# Patient Record
Sex: Male | Born: 1960 | Race: Black or African American | Hispanic: No | Marital: Married | Smoking: Former smoker
Health system: Southern US, Community
[De-identification: ages and names within clinical notes are randomized; demographics above are authoritative.]

## PROBLEM LIST (undated history)

## (undated) DIAGNOSIS — E039 Hypothyroidism, unspecified: Secondary | ICD-10-CM

## (undated) DIAGNOSIS — M109 Gout, unspecified: Secondary | ICD-10-CM

## (undated) HISTORY — PX: KNEE SURGERY: SHX244

---

## 2000-01-17 ENCOUNTER — Encounter: Payer: Self-pay | Admitting: Internal Medicine

## 2000-01-17 ENCOUNTER — Emergency Department (HOSPITAL_COMMUNITY): Admission: EM | Admit: 2000-01-17 | Discharge: 2000-01-17 | Payer: Self-pay | Admitting: Internal Medicine

## 2000-12-24 ENCOUNTER — Encounter: Payer: Self-pay | Admitting: Emergency Medicine

## 2000-12-24 ENCOUNTER — Emergency Department (HOSPITAL_COMMUNITY): Admission: EM | Admit: 2000-12-24 | Discharge: 2000-12-24 | Payer: Self-pay | Admitting: Emergency Medicine

## 2001-05-23 ENCOUNTER — Emergency Department (HOSPITAL_COMMUNITY): Admission: EM | Admit: 2001-05-23 | Discharge: 2001-05-23 | Payer: Self-pay | Admitting: Emergency Medicine

## 2002-05-19 ENCOUNTER — Emergency Department (HOSPITAL_COMMUNITY): Admission: EM | Admit: 2002-05-19 | Discharge: 2002-05-20 | Payer: Self-pay | Admitting: Emergency Medicine

## 2003-05-16 ENCOUNTER — Emergency Department (HOSPITAL_COMMUNITY): Admission: EM | Admit: 2003-05-16 | Discharge: 2003-05-16 | Payer: Self-pay | Admitting: Emergency Medicine

## 2003-05-21 ENCOUNTER — Emergency Department (HOSPITAL_COMMUNITY): Admission: EM | Admit: 2003-05-21 | Discharge: 2003-05-21 | Payer: Self-pay | Admitting: Emergency Medicine

## 2003-05-23 ENCOUNTER — Emergency Department (HOSPITAL_COMMUNITY): Admission: EM | Admit: 2003-05-23 | Discharge: 2003-05-23 | Payer: Self-pay | Admitting: *Deleted

## 2003-07-21 ENCOUNTER — Encounter: Payer: Self-pay | Admitting: Family Medicine

## 2003-07-21 ENCOUNTER — Encounter: Admission: RE | Admit: 2003-07-21 | Discharge: 2003-07-21 | Payer: Self-pay | Admitting: Family Medicine

## 2005-03-29 ENCOUNTER — Emergency Department (HOSPITAL_COMMUNITY): Admission: EM | Admit: 2005-03-29 | Discharge: 2005-03-29 | Payer: Self-pay | Admitting: Emergency Medicine

## 2005-03-30 ENCOUNTER — Emergency Department (HOSPITAL_COMMUNITY): Admission: AD | Admit: 2005-03-30 | Discharge: 2005-03-30 | Payer: Self-pay | Admitting: Family Medicine

## 2005-04-03 ENCOUNTER — Ambulatory Visit: Payer: Self-pay | Admitting: Internal Medicine

## 2005-04-03 ENCOUNTER — Ambulatory Visit: Payer: Self-pay | Admitting: *Deleted

## 2005-04-03 ENCOUNTER — Ambulatory Visit: Payer: Self-pay | Admitting: Nurse Practitioner

## 2005-04-21 ENCOUNTER — Emergency Department (HOSPITAL_COMMUNITY): Admission: EM | Admit: 2005-04-21 | Discharge: 2005-04-21 | Payer: Self-pay | Admitting: Family Medicine

## 2005-04-22 ENCOUNTER — Ambulatory Visit: Payer: Self-pay | Admitting: Nurse Practitioner

## 2005-05-06 ENCOUNTER — Ambulatory Visit: Payer: Self-pay | Admitting: Nurse Practitioner

## 2005-05-10 ENCOUNTER — Encounter: Admission: RE | Admit: 2005-05-10 | Discharge: 2005-05-10 | Payer: Self-pay | Admitting: Internal Medicine

## 2005-05-28 ENCOUNTER — Ambulatory Visit: Payer: Self-pay | Admitting: Nurse Practitioner

## 2005-08-11 ENCOUNTER — Ambulatory Visit: Payer: Self-pay | Admitting: Nurse Practitioner

## 2005-08-20 ENCOUNTER — Ambulatory Visit: Payer: Self-pay | Admitting: Nurse Practitioner

## 2005-09-03 ENCOUNTER — Ambulatory Visit: Payer: Self-pay | Admitting: Nurse Practitioner

## 2006-01-14 ENCOUNTER — Emergency Department (HOSPITAL_COMMUNITY): Admission: EM | Admit: 2006-01-14 | Discharge: 2006-01-14 | Payer: Self-pay | Admitting: Emergency Medicine

## 2006-09-23 ENCOUNTER — Ambulatory Visit: Payer: Self-pay | Admitting: Family Medicine

## 2007-08-13 DIAGNOSIS — E0590 Thyrotoxicosis, unspecified without thyrotoxic crisis or storm: Secondary | ICD-10-CM | POA: Insufficient documentation

## 2008-02-09 ENCOUNTER — Ambulatory Visit: Payer: Self-pay | Admitting: Family Medicine

## 2008-02-09 DIAGNOSIS — B356 Tinea cruris: Secondary | ICD-10-CM | POA: Insufficient documentation

## 2008-02-10 LAB — CONVERTED CEMR LAB
ALT: 22 units/L (ref 0–53)
AST: 19 units/L (ref 0–37)
Alkaline Phosphatase: 74 units/L (ref 39–117)
BUN: 8 mg/dL (ref 6–23)
Basophils Relative: 0.2 % (ref 0.0–1.0)
CO2: 31 meq/L (ref 19–32)
Calcium: 9.3 mg/dL (ref 8.4–10.5)
Chloride: 107 meq/L (ref 96–112)
Cholesterol: 143 mg/dL (ref 0–200)
Direct LDL: 80.5 mg/dL
Eosinophils Relative: 2.4 % (ref 0.0–5.0)
GFR calc Af Amer: 84 mL/min
Glucose, Bld: 96 mg/dL (ref 70–99)
HDL: 34.2 mg/dL — ABNORMAL LOW (ref >39.0)
Monocytes Relative: 8 % (ref 3.0–11.0)
Neutro Abs: 4.3 10*3/uL (ref 1.4–7.7)
Platelets: 205 10*3/uL (ref 150–400)
RBC: 5.32 M/uL (ref 4.22–5.81)
RDW: 12.5 % (ref 11.5–14.6)
TSH: 2.52 microintl units/mL (ref 0.35–5.50)
Total CHOL/HDL Ratio: 4.2
Total Protein: 7.1 g/dL (ref 6.0–8.3)
Triglycerides: 206 mg/dL (ref 0–149)
VLDL: 41 mg/dL — ABNORMAL HIGH (ref 0–40)
WBC: 8.2 10*3/uL (ref 4.5–10.5)

## 2008-02-23 ENCOUNTER — Ambulatory Visit: Payer: Self-pay | Admitting: Family Medicine

## 2008-02-23 DIAGNOSIS — E876 Hypokalemia: Secondary | ICD-10-CM | POA: Insufficient documentation

## 2008-03-08 ENCOUNTER — Ambulatory Visit: Payer: Self-pay | Admitting: Family Medicine

## 2008-03-09 LAB — CONVERTED CEMR LAB: Potassium: 3.8 meq/L (ref 3.5–5.1)

## 2008-08-16 ENCOUNTER — Ambulatory Visit: Payer: Self-pay | Admitting: Family Medicine

## 2009-02-08 ENCOUNTER — Encounter: Payer: Self-pay | Admitting: Family Medicine

## 2009-02-08 ENCOUNTER — Emergency Department (HOSPITAL_COMMUNITY): Admission: EM | Admit: 2009-02-08 | Discharge: 2009-02-08 | Payer: Self-pay | Admitting: Emergency Medicine

## 2009-02-16 ENCOUNTER — Ambulatory Visit: Payer: Self-pay | Admitting: Family Medicine

## 2009-02-16 DIAGNOSIS — J209 Acute bronchitis, unspecified: Secondary | ICD-10-CM

## 2009-03-02 ENCOUNTER — Ambulatory Visit: Payer: Self-pay | Admitting: Family Medicine

## 2011-01-05 ENCOUNTER — Encounter: Payer: Self-pay | Admitting: Internal Medicine

## 2011-08-01 ENCOUNTER — Encounter: Payer: Self-pay | Admitting: *Deleted

## 2011-08-01 ENCOUNTER — Emergency Department (HOSPITAL_BASED_OUTPATIENT_CLINIC_OR_DEPARTMENT_OTHER)
Admission: EM | Admit: 2011-08-01 | Discharge: 2011-08-02 | Disposition: A | Discharge status: Home / Self Care | Payer: Self-pay | Attending: Emergency Medicine | Admitting: Emergency Medicine

## 2011-08-01 DIAGNOSIS — R51 Headache: Secondary | ICD-10-CM | POA: Insufficient documentation

## 2011-08-01 DIAGNOSIS — R0602 Shortness of breath: Secondary | ICD-10-CM | POA: Insufficient documentation

## 2011-08-01 DIAGNOSIS — E039 Hypothyroidism, unspecified: Secondary | ICD-10-CM | POA: Insufficient documentation

## 2011-08-01 DIAGNOSIS — R5381 Other malaise: Secondary | ICD-10-CM | POA: Insufficient documentation

## 2011-08-01 DIAGNOSIS — R5383 Other fatigue: Secondary | ICD-10-CM | POA: Insufficient documentation

## 2011-08-01 HISTORY — DX: Hypothyroidism, unspecified: E03.9

## 2011-08-01 NOTE — ED Notes (Signed)
Pt c/o constant headache x2-3 weeks. Somewhat relieved with Ibuprofen, but returns soon after. Denies any increase in stress, injury or recent illness. Pt has hx of thyroid disease, but is not currently on medications. States he does not remember the last time he had his thyroid level checked, and does not currently have a PMD. Pt c/o generalized fatigue, weakness and SOB with exertion. Hx of anxiety "and maybe its some of that."

## 2011-08-01 NOTE — ED Notes (Signed)
Headache x2-3weeks. Also c/o shortness of breath with generalized fatigue/weakness x 2 weeks. Sore throat x1 week. Pt denies recent cold/cough symptoms.

## 2011-08-02 ENCOUNTER — Other Ambulatory Visit: Payer: Self-pay

## 2011-08-02 ENCOUNTER — Emergency Department (INDEPENDENT_AMBULATORY_CARE_PROVIDER_SITE_OTHER): Payer: Self-pay

## 2011-08-02 DIAGNOSIS — Z862 Personal history of diseases of the blood and blood-forming organs and certain disorders involving the immune mechanism: Secondary | ICD-10-CM

## 2011-08-02 DIAGNOSIS — R5381 Other malaise: Secondary | ICD-10-CM

## 2011-08-02 DIAGNOSIS — R0602 Shortness of breath: Secondary | ICD-10-CM

## 2011-08-02 DIAGNOSIS — Z8639 Personal history of other endocrine, nutritional and metabolic disease: Secondary | ICD-10-CM

## 2011-08-02 DIAGNOSIS — R5383 Other fatigue: Secondary | ICD-10-CM

## 2011-08-02 DIAGNOSIS — R51 Headache: Secondary | ICD-10-CM

## 2011-08-02 LAB — CBC
MCH: 28 pg (ref 26.0–34.0)
MCHC: 33.9 g/dL (ref 30.0–36.0)
MCV: 82.5 fL (ref 78.0–100.0)
Platelets: 191 10*3/uL (ref 150–400)
RBC: 4.79 MIL/uL (ref 4.22–5.81)

## 2011-08-02 LAB — URINALYSIS, ROUTINE W REFLEX MICROSCOPIC
Bilirubin Urine: NEGATIVE
Glucose, UA: NEGATIVE mg/dL
Ketones, ur: NEGATIVE mg/dL
pH: 6 (ref 5.0–8.0)

## 2011-08-02 LAB — BASIC METABOLIC PANEL
CO2: 25 mEq/L (ref 19–32)
Calcium: 9.3 mg/dL (ref 8.4–10.5)
Creatinine, Ser: 1 mg/dL (ref 0.50–1.35)
Glucose, Bld: 98 mg/dL (ref 70–99)

## 2011-08-02 LAB — TSH: TSH: 3.642 u[IU]/mL (ref 0.350–4.500)

## 2011-08-02 NOTE — ED Provider Notes (Signed)
History     CSN: [id number] Arrival date & time: 08/01/2011 11:20 PM  Chief Complaint  Patient presents with  . Headache   HPI Comments: Pt has hx of hypothyroidism.  Has been off meds for about a year.  Complains tonight of throbbing headache off/on for 2 weeks.  Also has been more tired than normal.  Having some SOB on exertion, no pedal edema, no chest pain, no orthopnea, no pleuritic pain.  No head injury  Patient is a 50 y.o. male presenting with headaches. The history is provided by the patient.  Headache  This is a new problem. The current episode started more than 1 week ago. Episode frequency: waxing and waning. The problem has been gradually worsening. The headache is associated with bright light and emotional stress. The pain is located in the bilateral region. The quality of the pain is described as dull and throbbing. The pain is at a severity of 4/10. The pain is moderate. The pain does not radiate. Associated symptoms include malaise/fatigue and shortness of breath. Pertinent negatives include no fever, no chest pressure, no near-syncope, no orthopnea, no palpitations, no syncope, no nausea and no vomiting. He has tried nothing for the symptoms.    Past Medical History  Diagnosis Date  . Hypothyroid     Past Surgical History  Procedure Date  . Knee surgery     No family history on file.  History  Substance Use Topics  . Smoking status: Former Games developer  . Smokeless tobacco: Not on file  . Alcohol Use: No      Review of Systems  Constitutional: Positive for malaise/fatigue and fatigue. Negative for fever and appetite change.  HENT: Negative for congestion, facial swelling and rhinorrhea.   Eyes: Negative.   Respiratory: Positive for shortness of breath.   Cardiovascular: Negative for palpitations, orthopnea, syncope and near-syncope.  Gastrointestinal: Negative.  Negative for nausea and vomiting.  Genitourinary: Negative.   Musculoskeletal: Negative.   Skin:  Negative.   Neurological: Positive for headaches. Negative for dizziness, speech difficulty, weakness, light-headedness and numbness.  Psychiatric/Behavioral: Negative.     Physical Exam  BP 111/73  Pulse 54  Temp(Src) 98.1 F (36.7 C) (Oral)  Resp 18  Ht 6\' 3"  (1.905 m)  Wt 315 lb (142.883 kg)  BMI 39.37 kg/m2  SpO2 99%  Physical Exam  Constitutional: He is oriented to person, place, and time. He appears well-developed and well-nourished.  HENT:  Head: Normocephalic and atraumatic.  Eyes: Pupils are equal, round, and reactive to light.  Neck: Normal range of motion. Neck supple.  Cardiovascular: Normal rate, regular rhythm, normal heart sounds and intact distal pulses.   Pulmonary/Chest: Effort normal and breath sounds normal.  Abdominal: Soft. Bowel sounds are normal.  Musculoskeletal: Normal range of motion.  Neurological: He is alert and oriented to person, place, and time.  Skin: Skin is warm and dry.    ED Course  Procedures  MDM Doubt ACS, may be thyroid related, TSH pending , no evidence of infection, no acute cranial abnormaility, nothing to suggest ICH/meningits    Results for orders placed during the hospital encounter of 08/01/11  CBC      Component Value Range   WBC 7.6  4.0 - 10.5 (K/uL)   RBC 4.79  4.22 - 5.81 (MIL/uL)   Hemoglobin 13.4  13.0 - 17.0 (g/dL)   HCT 40.9  81.1 - 91.4 (%)   MCV 82.5  78.0 - 100.0 (fL)   MCH 28.0  26.0 -  34.0 (pg)   MCHC 33.9  30.0 - 36.0 (g/dL)   RDW 13.2  44.0 - 10.2 (%)   Platelets 191  150 - 400 (K/uL)  BASIC METABOLIC PANEL      Component Value Range   Sodium 140  135 - 145 (mEq/L)   Potassium 3.8  3.5 - 5.1 (mEq/L)   Chloride 105  96 - 112 (mEq/L)   CO2 25  19 - 32 (mEq/L)   Glucose, Bld 98  70 - 99 (mg/dL)   BUN 17  6 - 23 (mg/dL)   Creatinine, Ser 7.25  0.50 - 1.35 (mg/dL)   Calcium 9.3  8.4 - 36.6 (mg/dL)   GFR calc non Af Amer >60  >60 (mL/min)   GFR calc Af Amer >60  >60 (mL/min)  URINALYSIS, ROUTINE W  REFLEX MICROSCOPIC      Component Value Range   Color, Urine YELLOW  YELLOW    Appearance CLEAR  CLEAR    Specific Gravity, Urine 1.031 (*) 1.005 - 1.030    pH 6.0  5.0 - 8.0    Glucose, UA NEGATIVE  NEGATIVE (mg/dL)   Hgb urine dipstick NEGATIVE  NEGATIVE    Bilirubin Urine NEGATIVE  NEGATIVE    Ketones, ur NEGATIVE  NEGATIVE (mg/dL)   Protein, ur NEGATIVE  NEGATIVE (mg/dL)   Urobilinogen, UA 2.0 (*) 0.0 - 1.0 (mg/dL)   Nitrite NEGATIVE  NEGATIVE    Leukocytes, UA NEGATIVE  NEGATIVE    Dg Chest 2 View  08/02/2011  *RADIOLOGY REPORT*  Clinical Data: Shortness of breath, headache, history thyroid disease  CHEST - 2 VIEW  Comparison: 02/08/2009  Findings: Upper-normal size of cardiac silhouette. Mediastinal contours and pulmonary vascularity normal. Lungs clear. Decreased peribronchial thickening. No pleural effusion or pneumothorax. Right AC joint degenerative changes. Bones otherwise unremarkable.  IMPRESSION: No acute abnormalities.  Original Report Authenticated By: Lollie Marrow, M.D.   Ct Head Wo Contrast  08/02/2011  *RADIOLOGY REPORT*  Clinical Data: Headache, history thyroid disease, fatigue, weakness, shortness of breath, anxiety  CT HEAD WITHOUT CONTRAST  Technique:  Contiguous axial images were obtained from the base of the skull through the vertex without contrast.  Comparison: None  Findings: Normal ventricular morphology. No midline shift or mass effect. Normal appearance of brain parenchyma. No intracranial hemorrhage, mass lesion, or acute infarction. Visualized paranasal sinuses and mastoid air cells clear. Bones unremarkable.  IMPRESSION: Normal exam.  Original Report Authenticated By: Lollie Marrow, M.D.    EKG: normal EKG, normal sinus rhythm, there are no previous tracings available for comparison, normal sinus rhythm, normal axis, no ST/T wave changes.  TSH pending, pt lives in [place], will find PMD there, stressed importance of f/u, may need stress test for  continued symtpoms   Rolan Bucco, MD 08/02/11 0210

## 2013-03-06 ENCOUNTER — Emergency Department (INDEPENDENT_AMBULATORY_CARE_PROVIDER_SITE_OTHER)
Admission: EM | Admit: 2013-03-06 | Discharge: 2013-03-06 | Disposition: A | Discharge status: Home / Self Care | Payer: Self-pay | Source: Home / Self Care

## 2013-03-06 ENCOUNTER — Encounter (HOSPITAL_COMMUNITY): Payer: Self-pay | Admitting: *Deleted

## 2013-03-06 DIAGNOSIS — G589 Mononeuropathy, unspecified: Secondary | ICD-10-CM

## 2013-03-06 DIAGNOSIS — G629 Polyneuropathy, unspecified: Secondary | ICD-10-CM

## 2013-03-06 DIAGNOSIS — M109 Gout, unspecified: Secondary | ICD-10-CM

## 2013-03-06 HISTORY — DX: Gout, unspecified: M10.9

## 2013-03-06 MED ORDER — INDOMETHACIN 50 MG PO CAPS: 50.0000 mg | ORAL_CAPSULE | Freq: Three times a day (TID) | ORAL | Status: DC

## 2013-03-06 MED ORDER — AMITRIPTYLINE HCL 25 MG PO TABS: 25.0000 mg | ORAL_TABLET | Freq: Every day | ORAL | Status: DC

## 2013-03-06 MED ORDER — AMITRIPTYLINE HCL 25 MG PO TABS: 25.0000 mg | ORAL_TABLET | Freq: Every day | ORAL | Status: AC

## 2013-03-06 MED ORDER — INDOMETHACIN 50 MG PO CAPS: 50.0000 mg | ORAL_CAPSULE | Freq: Three times a day (TID) | ORAL | Status: AC

## 2013-03-06 NOTE — ED Notes (Signed)
Pt  Reports   Pain l  Foot    -  History  Gout  No  meds   5  Months       -  Also  Reports   decreased  Grip  Both  Hands  History  Carpal  tunnnel

## 2013-03-06 NOTE — ED Provider Notes (Signed)
Ricardo Patrick is a 52 y.o. male who presents to Urgent Care today for gout in the left great toe and bilateral hand neuropathy.   Patient was diagnosed with gout last fall. Yesterday evening his left great toe became painful and swollen. He has tried ibuprofen which is only been marginally helpful. He notes pain to touch and motion. He notes that he is walking with a limp because of toe pain. He denies any fevers or chills weakness or numbness in his lower extremity.   Additionally he notes unrelated bilateral hand tingling. He states that he has been previously diagnosed with carpal tunnel syndrome. He wonders if there's any medications he can take.     PMH reviewed.  Gout, carpal tunnel syndrome. 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  History  Substance Use Topics  . Smoking status: Former Games developer  . Smokeless tobacco: Not on file  . Alcohol Use: No   ROS as above Medications reviewed. No current facility-administered medications for this encounter.   Current Outpatient Prescriptions  Medication Sig Dispense Refill  . albuterol (PROVENTIL,VENTOLIN) 90 MCG/ACT inhaler Inhale 2 puffs into the lungs every 6 (six) hours as needed. Shortness of breath and wheezing       . amitriptyline (ELAVIL) 25 MG tablet Take 1 tablet (25 mg total) by mouth at bedtime.  30 tablet  1  . ibuprofen (ADVIL,MOTRIN) 200 MG tablet Take 800 mg by mouth every 6 (six) hours as needed. pain       . indomethacin (INDOCIN) 50 MG capsule Take 1 capsule (50 mg total) by mouth 3 (three) times daily with meals.  40 capsule  0    Exam:  BP 128/72  Pulse 72  Temp(Src) 98.6 F (37 C) (Oral)  Resp 16  SpO2 100% Gen: Well NAD Left great toe: Swollen tender. Decreased motion due to pain. Pulses capillary refill and sensation are intact.  Hands bilaterally. Normal appearing normal motion and strength. Decreased sensation with positive Tinel's of the carpal tunnel.  Neck: Nontender spinal midline normal range of  motion.    No results found for this or any previous visit (from the past 24 hour(s)). No results found.  Assessment and Plan: 52 y.o. male with  1) gout flare: Treatment with indomethacin. [facility] if not improved.  2) carpal total: Bilaterally. Treatment with amitriptyline at night. Followup at [facility].  Discussed warning signs or symptoms. Please see discharge instructions. Patient expresses understanding.      Rodolph Bong, MD 03/06/13 [phone]

## 2013-03-15 NOTE — ED Provider Notes (Signed)
Medical screening examination/treatment/procedure(s) were performed by resident physician or non-physician practitioner and as supervising physician I was immediately available for consultation/collaboration.   Veleria Barnhardt DOUGLAS MD.   Tuyen Uncapher D Lowry Bala, MD 03/15/13 1012 

## 2013-11-06 ENCOUNTER — Emergency Department (INDEPENDENT_AMBULATORY_CARE_PROVIDER_SITE_OTHER): Payer: Self-pay

## 2013-11-06 ENCOUNTER — Emergency Department (INDEPENDENT_AMBULATORY_CARE_PROVIDER_SITE_OTHER)
Admission: EM | Admit: 2013-11-06 | Discharge: 2013-11-06 | Disposition: A | Discharge status: Home / Self Care | Payer: Self-pay | Source: Home / Self Care | Attending: Emergency Medicine | Admitting: Emergency Medicine

## 2013-11-06 ENCOUNTER — Encounter (HOSPITAL_COMMUNITY): Payer: Self-pay | Admitting: Emergency Medicine

## 2013-11-06 DIAGNOSIS — M222X1 Patellofemoral disorders, right knee: Secondary | ICD-10-CM

## 2013-11-06 DIAGNOSIS — M25569 Pain in unspecified knee: Secondary | ICD-10-CM

## 2013-11-06 DIAGNOSIS — M2240 Chondromalacia patellae, unspecified knee: Secondary | ICD-10-CM

## 2013-11-06 MED ORDER — KETOROLAC TROMETHAMINE 60 MG/2ML IM SOLN
INTRAMUSCULAR | Status: AC
  Filled 2013-11-06: qty 2

## 2013-11-06 MED ORDER — DICLOFENAC SODIUM 75 MG PO TBEC: 75.0000 mg | DELAYED_RELEASE_TABLET | Freq: Two times a day (BID) | ORAL | Status: AC

## 2013-11-06 MED ORDER — KETOROLAC TROMETHAMINE 60 MG/2ML IM SOLN
60.0000 mg | Freq: Once | INTRAMUSCULAR | Status: AC
  Administered 2013-11-06: 60 mg via INTRAMUSCULAR

## 2013-11-06 NOTE — ED Provider Notes (Signed)
Chief Complaint:   Chief Complaint  Patient presents with  . Knee Pain    History of Present Illness:   Ricardo Patrick is a 52 year old male who has had a 3 -day history of right knee pain. He was doing some yard work the day before, squatting down in his yard. He did not twist his knee or injured in any other way. The knee appears puffy in the pain is a 10 over 10 in intensity. It sometimes feels like it's going to give way. It is better if he sits or lies down. There is no locking of the knee. He's had no prior knee pain or injury. He does have a history of gout but does not feel this is like his gout.  Review of Systems:  Other than noted above, the patient denies any of the following symptoms: Systemic:  No fevers, chills, sweats, or aches.  No fatigue or tiredness. Musculoskeletal:  No joint pain, arthritis, bursitis, swelling, back pain, or neck pain.  Neurological:  No muscular weakness, paresthesias, headache, or trouble with speech or coordination.  No dizziness.  PMFSH:  Past medical history, family history, social history, meds, and allergies were reviewed.  No prior history of knee pain or arthritis.   Physical Exam:   Vital signs:  BP 138/78  Pulse 80  Temp(Src) 98.4 F (36.9 C) (Oral)  Resp 16  SpO2 97% Gen:  Alert and oriented times 3.  In no distress. Musculoskeletal: There is pain to palpation over the patella. No fluid or effusion present. He has a limited range of motion with about 30 of flexion. Unable to perform McMurray's test. Lachman's test was negative.  Anterior drawer test was negative.   Varus and valgus stress negative.  Otherwise, all joints had a full a ROM with no swelling, bruising or deformity.  No edema, pulses full. Extremities were warm and pink.  Capillary refill was brisk.  Skin:  Clear, warm and dry.  No rash. Neuro:  Alert and oriented times 3.  Muscle strength was normal.  Sensation was intact to light touch.   Radiology:  Dg Knee Complete 4 Views  Right  11/06/2013   CLINICAL DATA:  Right knee pain without injury.  EXAM: RIGHT KNEE - COMPLETE 4+ VIEW  COMPARISON:  None.  FINDINGS: No fracture or dislocation is noted. No joint effusion is noted. Mild narrowing of medial joint space is noted. Probable osteochondromas seen arising posteriorly from the distal right femur.  IMPRESSION: Mild degenerative joint disease. No fracture or dislocation is noted. Probable osteochondroma seen arising posteriorly from distal right femoral shaft.   Electronically Signed   By: Roque Lias M.D.   On: 11/06/2013 10:41   I reviewed the images independently and personally and concur with the radiologist's findings.   Course in Urgent Care Center:   Given Toradol 60 mg IM. He was placed in a knee sleeve.  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  Plan:   1.  Meds:  The following meds were prescribed:   Discharge Medication List as of 11/06/2013 10:55 AM    START taking these medications   Details  diclofenac (VOLTAREN) 75 MG EC tablet Take 1 tablet (75 mg total) by mouth 2 (two) times daily., Starting 11/06/2013, Until Discontinued, Normal        2.  Patient Education/Counseling:  The patient was given appropriate handouts, self care instructions, and  instructed in symptomatic relief, including rest and activity, elevation, application of ice and compression.   3.  Follow up:  The patient was told to follow up if no better in 3 to 4 days, if becoming worse in any way, and given some red flag symptoms such as persistent pain which would prompt immediate return.  Follow up with Dr. Trudee Grip if no better in 2 weeks.     Reuben Likes, MD 11/06/13 [phone]

## 2013-11-06 NOTE — ED Notes (Signed)
Reports raking leaves on Thurs and kneeling onto right knee frequently.  Friday started with right knee pain, "last night it was unbearable".  Has taken IBU and applied heat ("ice does not feel good").

## 2014-10-25 ENCOUNTER — Encounter (HOSPITAL_COMMUNITY): Payer: Self-pay | Admitting: *Deleted

## 2014-10-25 ENCOUNTER — Emergency Department (HOSPITAL_COMMUNITY)
Admission: EM | Admit: 2014-10-25 | Discharge: 2014-10-25 | Disposition: A | Discharge status: Home / Self Care | Payer: Self-pay | Attending: Emergency Medicine | Admitting: Emergency Medicine

## 2014-10-25 ENCOUNTER — Emergency Department (HOSPITAL_COMMUNITY): Payer: Self-pay

## 2014-10-25 DIAGNOSIS — S4992XA Unspecified injury of left shoulder and upper arm, initial encounter: Secondary | ICD-10-CM | POA: Insufficient documentation

## 2014-10-25 DIAGNOSIS — Z8739 Personal history of other diseases of the musculoskeletal system and connective tissue: Secondary | ICD-10-CM | POA: Insufficient documentation

## 2014-10-25 DIAGNOSIS — M25512 Pain in left shoulder: Secondary | ICD-10-CM

## 2014-10-25 DIAGNOSIS — Z791 Long term (current) use of non-steroidal anti-inflammatories (NSAID): Secondary | ICD-10-CM | POA: Insufficient documentation

## 2014-10-25 DIAGNOSIS — Y9389 Activity, other specified: Secondary | ICD-10-CM | POA: Insufficient documentation

## 2014-10-25 DIAGNOSIS — W06XXXA Fall from bed, initial encounter: Secondary | ICD-10-CM | POA: Insufficient documentation

## 2014-10-25 DIAGNOSIS — Y998 Other external cause status: Secondary | ICD-10-CM | POA: Insufficient documentation

## 2014-10-25 DIAGNOSIS — Z79899 Other long term (current) drug therapy: Secondary | ICD-10-CM | POA: Insufficient documentation

## 2014-10-25 DIAGNOSIS — Z87891 Personal history of nicotine dependence: Secondary | ICD-10-CM | POA: Insufficient documentation

## 2014-10-25 DIAGNOSIS — W19XXXA Unspecified fall, initial encounter: Secondary | ICD-10-CM

## 2014-10-25 DIAGNOSIS — S61412A Laceration without foreign body of left hand, initial encounter: Secondary | ICD-10-CM | POA: Insufficient documentation

## 2014-10-25 DIAGNOSIS — Z8639 Personal history of other endocrine, nutritional and metabolic disease: Secondary | ICD-10-CM | POA: Insufficient documentation

## 2014-10-25 DIAGNOSIS — Y92812 Truck as the place of occurrence of the external cause: Secondary | ICD-10-CM | POA: Insufficient documentation

## 2014-10-25 MED ORDER — TETANUS-DIPHTH-ACELL PERTUSSIS 5-2.5-18.5 LF-MCG/0.5 IM SUSP
0.5000 mL | Freq: Once | INTRAMUSCULAR | Status: AC
  Administered 2014-10-25: 0.5 mL via INTRAMUSCULAR
  Filled 2014-10-25: qty 0.5

## 2014-10-25 MED ORDER — MELOXICAM 7.5 MG PO TABS: ORAL_TABLET | ORAL | Status: AC

## 2014-10-25 MED ORDER — LIDOCAINE-EPINEPHRINE (PF) 2 %-1:200000 IJ SOLN
10.0000 mL | Freq: Once | INTRAMUSCULAR | Status: AC
  Administered 2014-10-25: 10 mL
  Filled 2014-10-25: qty 20

## 2014-10-25 NOTE — ED Provider Notes (Signed)
CSN: [id number]     Arrival date & time 10/25/14  0827 History  This chart was scribed for non-physician practitioner, Junius FinnerErin O'Malley, PA-C, working with Purvis SheffieldForrest Harrison, MD by Charline BillsEssence Howell, ED Scribe. This patient was seen in room TR07C/TR07C and the patient's care was started at 9:17 AM.    Chief Complaint  Patient presents with  . Fall  . Hand Pain   The history is provided by the patient. No language interpreter was used.   HPI Comments: Ricardo Patrick is a 53 y.o. male who presents to the Emergency Department complaining of fall that occurred yesterday. Pt fell off the bed of a truck yesterday around 6PM and broke his fall with his hands. No LOC. He denies hitting his head. Pt reports associated L shoulder pain, L hand pain and a L hand laceration. Pt currently rates his pain 10/10 and describes the pain as a throbbing sensation. He denies numbness/tingling. No past surgeries on hand or surgery. Pt is ambidextrous. Last tetanus unknown.   Past Medical History  Diagnosis Date  . Hypothyroid   . Gout    Past Surgical History  Procedure Laterality Date  . Knee surgery     History reviewed. No pertinent family history. History  Substance Use Topics  . Smoking status: Former Games developermoker  . Smokeless tobacco: Not on file  . Alcohol Use: No    Review of Systems  Musculoskeletal: Positive for myalgias and arthralgias.  Skin: Positive for wound.  Neurological: Negative for syncope and numbness.  All other systems reviewed and are negative.  Allergies  Review of patient's allergies indicates no known allergies.  Home Medications   Prior to Admission medications   Medication Sig Start Date End Date Taking? Authorizing Provider  albuterol (PROVENTIL,VENTOLIN) 90 MCG/ACT inhaler Inhale 2 puffs into the lungs every 6 (six) hours as needed. Shortness of breath and wheezing     Historical Provider, MD  amitriptyline (ELAVIL) 25 MG tablet Take 1 tablet (25 mg total) by mouth at bedtime.  03/06/13   Rodolph BongEvan S Corey, MD  diclofenac (VOLTAREN) 75 MG EC tablet Take 1 tablet (75 mg total) by mouth 2 (two) times daily. 11/06/13   Reuben Likesavid C Keller, MD  indomethacin (INDOCIN) 50 MG capsule Take 1 capsule (50 mg total) by mouth 3 (three) times daily with meals. 03/06/13   Rodolph BongEvan S Corey, MD  meloxicam (MOBIC) 7.5 MG tablet Take 1-2 tabs daily for 7 days, then daily as needed for pain and inflammation 10/25/14   Junius FinnerErin O'Malley, PA-C   Triage Vitals: BP 134/79 mmHg  Pulse 83  Temp(Src) 97.9 F (36.6 C) (Oral)  Resp 17  SpO2 99% Physical Exam  Constitutional: He is oriented to person, place, and time. He appears well-developed and well-nourished.  HENT:  Head: Normocephalic and atraumatic.  Eyes: EOM are normal.  Neck: Normal range of motion.  Cardiovascular: Normal rate.   Pulses:      Radial pulses are 2+ on the left side.  Left hand: Cap refill <3 seconds  Pulmonary/Chest: Effort normal.  Musculoskeletal: Normal range of motion.  L shoulder: No obvious deformity Tender along deltoid musculature and AC joint Limited ROM with ABduction Full ROM with elbow without tenderness   Neurological: He is alert and oriented to person, place, and time.  Skin: Skin is warm and dry.  Dorsal aspect of L hand: 2 cm open laceration without tendon involvement Full ROM  Psychiatric: He has a normal mood and affect. His behavior is normal.  Nursing note and vitals reviewed.  ED Course  Procedures (including critical care time) DIAGNOSTIC STUDIES: Oxygen Saturation is 99% on RA, normal by my interpretation.    COORDINATION OF CARE: 9:21 AM-Discussed treatment plan which includes XRs and sutures with pt at bedside and pt agreed to plan.   LACERATION REPAIR PROCEDURE NOTE The patient's identification was confirmed and consent was obtained. This procedure was performed by Junius FinnerErin O'Malley, PA-C at 9:35 AM. Site: L hand Sterile procedures observed yes Anesthetic used (type and amt): 2% lidocaine  with epinephrine  Suture type/size: 4 prolene  Length: 2 cm # of Sutures: 2, loose Technique: interrupted  Complexity simple Antibx ointment applied yes Tetanus ordered  Site anesthetized, irrigated with NS, explored without evidence of foreign body, wound well approximated, site covered with dry, sterile dressing.  Patient tolerated procedure well without complications. Instructions for care discussed verbally and patient provided with additional written instructions for homecare and f/u.  Labs Review Labs Reviewed - No data to display  Imaging Review Dg Shoulder Left  10/25/2014   CLINICAL DATA:  Patient fell off tailgate of truck ; pain  EXAM: LEFT SHOULDER - 2+ VIEW  COMPARISON:  None.  FINDINGS: Frontal, Y scapular, and axillary images were obtained. There is no demonstrable fracture or dislocation. There is moderate osteoarthritic change in the acromioclavicular joint. There is slight osteoarthritic change in the acromioclavicular joint. No erosive change or intra-articular calcification.  IMPRESSION: Areas of osteoarthritic change. No fracture or dislocation apparent.   Electronically Signed   By: Bretta BangWilliam  Woodruff M.D.   On: 10/25/2014 09:04   Dg Hand Complete Left  10/25/2014   CLINICAL DATA:  Hit hand on bumper of truck. Pain to posterior left hand.  EXAM: LEFT HAND - COMPLETE 3+ VIEW  COMPARISON:  None.  FINDINGS: There is no evidence of fracture or dislocation. There is no evidence of arthropathy or other focal bone abnormality. Bandage material overlies the dorsum of the hand.  IMPRESSION: No acute bone abnormality.   Electronically Signed   By: Signa Kellaylor  Stroud M.D.   On: 10/25/2014 09:09     EKG Interpretation None      MDM   Final diagnoses:  Hand laceration, left, initial encounter  Left shoulder pain  Fall, initial encounter   Pt presenting to ED c/o left shoulder and left hand pain with laceration after fall from his truck around 6PM yesterday.  Left hand  laceration is gaping open but no tendon involvement. Wound sutured closed with 2 loose sutures. See procedure note. Advised to have sutures removed in 7-10 days. Home care instructions provided. Left shoulder: no fracture or dislocation on plain films, cannot r/o rotator cuff injury, concern due to decreased ROM. Placed pt in sling. Home exercises provided. Advised to call to schedule f/u appointment in 1-2 weeks if not improving.  Return precautions provided. Pt verbalized understanding and agreement with tx plan.   I personally performed the services described in this documentation, which was scribed in my presence. The recorded information has been reviewed and is accurate.    Junius Finnerrin O'Malley, PA-C 10/25/14 [id number]  Purvis SheffieldForrest Harrison, MD 10/25/14 [phone]

## 2014-10-25 NOTE — ED Notes (Signed)
Pt reports falling yesterday off truckbed and broke fall with his hands, denies hitting head or loc. Having pain to left hand and left shoulder.

## 2014-11-19 ENCOUNTER — Emergency Department (HOSPITAL_COMMUNITY)
Admission: EM | Admit: 2014-11-19 | Discharge: 2014-11-19 | Disposition: A | Discharge status: Home / Self Care | Payer: Self-pay | Attending: Emergency Medicine | Admitting: Emergency Medicine

## 2014-11-19 ENCOUNTER — Encounter (HOSPITAL_COMMUNITY): Payer: Self-pay | Admitting: Physical Medicine and Rehabilitation

## 2014-11-19 DIAGNOSIS — Z791 Long term (current) use of non-steroidal anti-inflammatories (NSAID): Secondary | ICD-10-CM | POA: Insufficient documentation

## 2014-11-19 DIAGNOSIS — Z87891 Personal history of nicotine dependence: Secondary | ICD-10-CM | POA: Insufficient documentation

## 2014-11-19 DIAGNOSIS — B349 Viral infection, unspecified: Secondary | ICD-10-CM

## 2014-11-19 DIAGNOSIS — Z8639 Personal history of other endocrine, nutritional and metabolic disease: Secondary | ICD-10-CM | POA: Insufficient documentation

## 2014-11-19 DIAGNOSIS — M109 Gout, unspecified: Secondary | ICD-10-CM | POA: Insufficient documentation

## 2014-11-19 DIAGNOSIS — Z79899 Other long term (current) drug therapy: Secondary | ICD-10-CM | POA: Insufficient documentation

## 2014-11-19 MED ORDER — PSEUDOEPHEDRINE-CODEINE-GG 30-10-100 MG/5ML PO SOLN: 10.0000 mL | Freq: Four times a day (QID) | ORAL | Status: AC | PRN

## 2014-11-19 NOTE — ED Notes (Signed)
Pt states flu-like symptoms. Reports cough, sore throat, runny nose, fever, body aches and headaches. Ongoing since Thursday. No relief from OTC cold/flu medications. Respirations unlabored. Lung sounds clear and equal bilaterally.

## 2014-11-19 NOTE — Discharge Instructions (Signed)

## 2014-11-19 NOTE — ED Notes (Signed)
Pt presents to department for evaluation of multiple complaints, pt states sore throat, cough, runny nose, headache, and body aches since Thursday. No relief with OTC medications at home. Respirations unlabored. Pt is alert and oriented x4. NAD.

## 2014-11-19 NOTE — ED Provider Notes (Signed)
CSN: [id number]     Arrival date & time 11/19/14  [id number] History   First MD Initiated Contact with Patient 11/19/14 [phone]     No chief complaint on file.    (Consider location/radiation/quality/duration/timing/severity/associated sxs/prior Treatment) HPI  53 year old male with history of hypothyroidism history of gout presents for evaluations of cough and myalgias. Patient states for the past 4 days he has a constellations of URI symptoms including nasal congestion, sneezing, nonproductive cough, chest congestion, body aches, and generalized fatigue. No significant fever or chills. No severe headache, neck stiffness, shortness of breath, hemoptysis, neck pain, abdominal cramping, nausea vomiting diarrhea, or rash. He has tried over-the-counter medication with minimal relief. He is a former smoker. He denies any recent travel overseas. He has no other complaint.  Past Medical History  Diagnosis Date  . Hypothyroid   . Gout    Past Surgical History  Procedure Laterality Date  . Knee surgery     No family history on file. History  Substance Use Topics  . Smoking status: Former Games developermoker  . Smokeless tobacco: Not on file  . Alcohol Use: No    Review of Systems  All other systems reviewed and are negative.     Allergies  Review of patient's allergies indicates no known allergies.  Home Medications   Prior to Admission medications   Medication Sig Start Date End Date Taking? Authorizing Provider  albuterol (PROVENTIL,VENTOLIN) 90 MCG/ACT inhaler Inhale 2 puffs into the lungs every 6 (six) hours as needed. Shortness of breath and wheezing     Historical Provider, MD  amitriptyline (ELAVIL) 25 MG tablet Take 1 tablet (25 mg total) by mouth at bedtime. 03/06/13   Rodolph BongEvan S Corey, MD  diclofenac (VOLTAREN) 75 MG EC tablet Take 1 tablet (75 mg total) by mouth 2 (two) times daily. 11/06/13   Reuben Likesavid C Keller, MD  indomethacin (INDOCIN) 50 MG capsule Take 1 capsule (50 mg total) by mouth 3  (three) times daily with meals. 03/06/13   Rodolph BongEvan S Corey, MD  meloxicam (MOBIC) 7.5 MG tablet Take 1-2 tabs daily for 7 days, then daily as needed for pain and inflammation 10/25/14   Junius FinnerErin O'Malley, PA-C   There were no vitals taken for this visit. Physical Exam  Constitutional: He is oriented to person, place, and time. He appears well-developed and well-nourished. No distress.  HENT:  Head: Atraumatic.  Right Ear: External ear normal.  Left Ear: External ear normal.  Nose: Nose normal.  Mouth/Throat: Oropharynx is clear and moist. No oropharyngeal exudate.  Eyes: Conjunctivae are normal.  Neck: Normal range of motion. Neck supple.  Cardiovascular: Normal rate and regular rhythm.   Pulmonary/Chest: Effort normal and breath sounds normal. No respiratory distress. He has no rales.  Abdominal: Soft. There is no tenderness.  Lymphadenopathy:    He has no cervical adenopathy.  Neurological: He is alert and oriented to person, place, and time.  Skin: No rash noted.  Psychiatric: He has a normal mood and affect.    ED Course  Procedures (including critical care time)  Patient presents with viral syndrome suspects is complaining of flu symptoms. He is afebrile, no hypoxia and a normal lung exam, therefore low suspicion for pneumonia. Given the myalgias and a constellations of flulike symptoms, I recommend symptomatic treatment only. Return precautions discussed. Patient made aware of what to expect in the course of the flu. He agrees to return if developing high fever, or worsening symptoms.  Labs Review Labs Reviewed - No data  to display  Imaging Review No results found.   EKG Interpretation None      MDM   Final diagnoses:  Viral syndrome    BP 124/63 mmHg  Pulse 79  Temp(Src) 98.6 F (37 C) (Oral)  Resp 18  SpO2 98%     Fayrene HelperBowie Yaritzi Craun, PA-C 11/19/14 1026  Doug SouSam Jacubowitz, MD 11/19/14 1641

## 2015-05-03 ENCOUNTER — Encounter (HOSPITAL_COMMUNITY): Payer: Self-pay | Admitting: Emergency Medicine

## 2015-05-03 ENCOUNTER — Emergency Department (HOSPITAL_COMMUNITY)
Admission: EM | Admit: 2015-05-03 | Discharge: 2015-05-03 | Disposition: A | Discharge status: Home / Self Care | Payer: Self-pay | Attending: Emergency Medicine | Admitting: Emergency Medicine

## 2015-05-03 DIAGNOSIS — M79675 Pain in left toe(s): Secondary | ICD-10-CM | POA: Insufficient documentation

## 2015-05-03 DIAGNOSIS — M109 Gout, unspecified: Secondary | ICD-10-CM | POA: Insufficient documentation

## 2015-05-03 DIAGNOSIS — Z79899 Other long term (current) drug therapy: Secondary | ICD-10-CM | POA: Insufficient documentation

## 2015-05-03 DIAGNOSIS — Z791 Long term (current) use of non-steroidal anti-inflammatories (NSAID): Secondary | ICD-10-CM | POA: Insufficient documentation

## 2015-05-03 DIAGNOSIS — Z87891 Personal history of nicotine dependence: Secondary | ICD-10-CM | POA: Insufficient documentation

## 2015-05-03 DIAGNOSIS — Z8739 Personal history of other diseases of the musculoskeletal system and connective tissue: Secondary | ICD-10-CM | POA: Insufficient documentation

## 2015-05-03 DIAGNOSIS — B351 Tinea unguium: Secondary | ICD-10-CM | POA: Insufficient documentation

## 2015-05-03 LAB — I-STAT CREATININE, ED: CREATININE: 1 mg/dL (ref 0.61–1.24)

## 2015-05-03 MED ORDER — KETOROLAC TROMETHAMINE 60 MG/2ML IM SOLN
30.0000 mg | Freq: Once | INTRAMUSCULAR | Status: AC
  Administered 2015-05-03: 30 mg via INTRAMUSCULAR
  Filled 2015-05-03: qty 2

## 2015-05-03 MED ORDER — COLCHICINE 0.6 MG PO TABS: 0.6000 mg | ORAL_TABLET | Freq: Two times a day (BID) | ORAL | Status: AC

## 2015-05-03 NOTE — Discharge Instructions (Signed)
Do not hesitate to return to the Emergency Department for any new, worsening or concerning symptoms.  ° °If you do not have a primary care doctor you can establish one at the  ° °[facility]: °[address] °[place] [address] °[phone] ° °After you establish care. Let them know you were seen in the emergency room. They must obtain records for further management.  ° ° °

## 2015-05-03 NOTE — ED Notes (Signed)
Pt reports left foot pain; pt suspects gout flare; full ROM and strong distal pulses present; pt ambulatory to room 2

## 2015-05-03 NOTE — ED Provider Notes (Signed)
CSN: [id number]     Arrival date & time 05/03/15  0545 History   First MD Initiated Contact with Patient 05/03/15 0600     Chief Complaint  Patient presents with  . Foot Pain     (Consider location/radiation/quality/duration/timing/severity/associated sxs/prior Treatment) HPI   Ricardo Patrick is a 54 y.o. male with past medical history significant for gout and hypothyroid complaining of atraumatic left foot pain in the great toe radiating up to mid foot onset yesterday. Patient states this is consistent with prior episodes of gout, states that he thinks he may have a something to cause the discomfort but is not sure what. He rates his pain at 8 out of 10, exacerbated by movement and palpation. Patient has not taken any pain medication prior to arrival. States that he could not sleep last night secondary to pain. 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  Past Medical History  Diagnosis Date  . Hypothyroid   . Gout    Past Surgical History  Procedure Laterality Date  . Knee surgery     History reviewed. No pertinent family history. History  Substance Use Topics  . Smoking status: Former Games developermoker  . Smokeless tobacco: Not on file  . Alcohol Use: No    Review of Systems   10 systems reviewed and found to be negative, except as noted in the HPI.   Allergies  Review of patient's allergies indicates no known allergies.  Home Medications   Prior to Admission medications   Medication Sig Start Date End Date Taking? Authorizing Provider  albuterol (PROVENTIL,VENTOLIN) 90 MCG/ACT inhaler Inhale 2 puffs into the lungs every 6 (six) hours as needed for wheezing or shortness of breath.     Historical Provider, MD  amitriptyline (ELAVIL) 25 MG tablet Take 1 tablet (25 mg total) by mouth at bedtime. Patient not taking: Reported on 05/03/2015 03/06/13   Rodolph BongEvan S Corey, MD  colchicine 0.6 MG tablet Take 1 tablet (0.6 mg total) by mouth 2 (two) times  daily. Max of 1.8 mg per day 05/03/15   Joni ReiningNicole Bonnee Zertuche, PA-C  diclofenac (VOLTAREN) 75 MG EC tablet Take 1 tablet (75 mg total) by mouth 2 (two) times daily. Patient not taking: Reported on 11/19/2014 11/06/13   Reuben Likesavid C Keller, MD  indomethacin (INDOCIN) 50 MG capsule Take 1 capsule (50 mg total) by mouth 3 (three) times daily with meals. Patient not taking: Reported on 11/19/2014 03/06/13   Rodolph BongEvan S Corey, MD  meloxicam (MOBIC) 7.5 MG tablet Take 1-2 tabs daily for 7 days, then daily as needed for pain and inflammation Patient not taking: Reported on 05/03/2015 10/25/14   Junius FinnerErin O'Malley, PA-C  pseudoephedrine-codeine-guaifenesin (MYTUSSIN DAC) 30-10-100 MG/5ML solution Take 10 mLs by mouth 4 (four) times daily as needed for cough. Patient not taking: Reported on 05/03/2015 11/19/14   Fayrene HelperBowie Tran, PA-C   BP 127/69 mmHg  Pulse 60  Temp(Src) 98.9 F (37.2 C) (Oral)  Resp 16  Ht 6' 3.5" (1.918 m)  Wt 295 lb (133.811 kg)  BMI 36.37 kg/m2  SpO2 100% Physical Exam  Constitutional: He is oriented to person, place, and time. He appears well-developed and well-nourished. No distress.  HENT:  Head: Normocephalic.  Eyes: Conjunctivae and EOM are normal.  Cardiovascular: Normal rate.   Pulmonary/Chest: Effort normal. No stridor.  Musculoskeletal: Normal range of motion.       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  Psychiatric: He has a normal mood and affect.  Nursing note and vitals reviewed.   ED Course  Procedures (including critical care time) Labs Review Labs Reviewed  I-STAT CREATININE, ED    Imaging Review No results found.   EKG Interpretation None      MDM   Final diagnoses:  Great toe pain, left    Filed Vitals:   05/03/15 0645 05/03/15 0710 05/03/15 0745  05/03/15 0803  BP: 120/73 123/69 121/79 127/69  Pulse: 67 68 62 60  Temp:      TempSrc:      Resp:   16 16  Height:      Weight:      SpO2: 99% 100% 100% 100%    Medications  ketorolac (TORADOL) injection 30 mg (30 mg Intramuscular Given 05/03/15 0801)    Ricardo Patrick is a pleasant 54 y.o. male presenting with atraumatic acute left great toe pain and system with prior episodes of gout. Will check a i-STAT creatinine prior to administering cultures seen. Patient states he does not have a PCP and does not have regular checkups.  Creatinine normal, patient will be given a shot of Toradol in the ED and discharged with colchicine.  Evaluation does not show pathology that would require ongoing emergent intervention or inpatient treatment. Pt is hemodynamically stable and mentating appropriately. Discussed findings and plan with patient/guardian, who agrees with care plan. All questions answered. Return precautions discussed and outpatient follow up given.   Discharge Medication List as of 05/03/2015  7:58 AM    START taking these medications   Details  colchicine 0.6 MG tablet Take 1 tablet (0.6 mg total) by mouth 2 (two) times daily. Max of 1.8 mg per day, Starting 05/03/2015, Until Discontinued, [employer]             Eino Whitner, PA-C 05/03/15 1720  Tomasita CrumbleAdeleke Oni, MD 05/03/15 2259

## 2015-05-09 IMAGING — CR DG HAND COMPLETE 3+V*L*
3 series · 3 of 3 positions shown · non-contrast
Comparison: None.

CLINICAL DATA: Hit hand on bumper of truck. Pain to posterior left
hand.

EXAM:
LEFT HAND - COMPLETE 3+ VIEW

[x hand pa left]
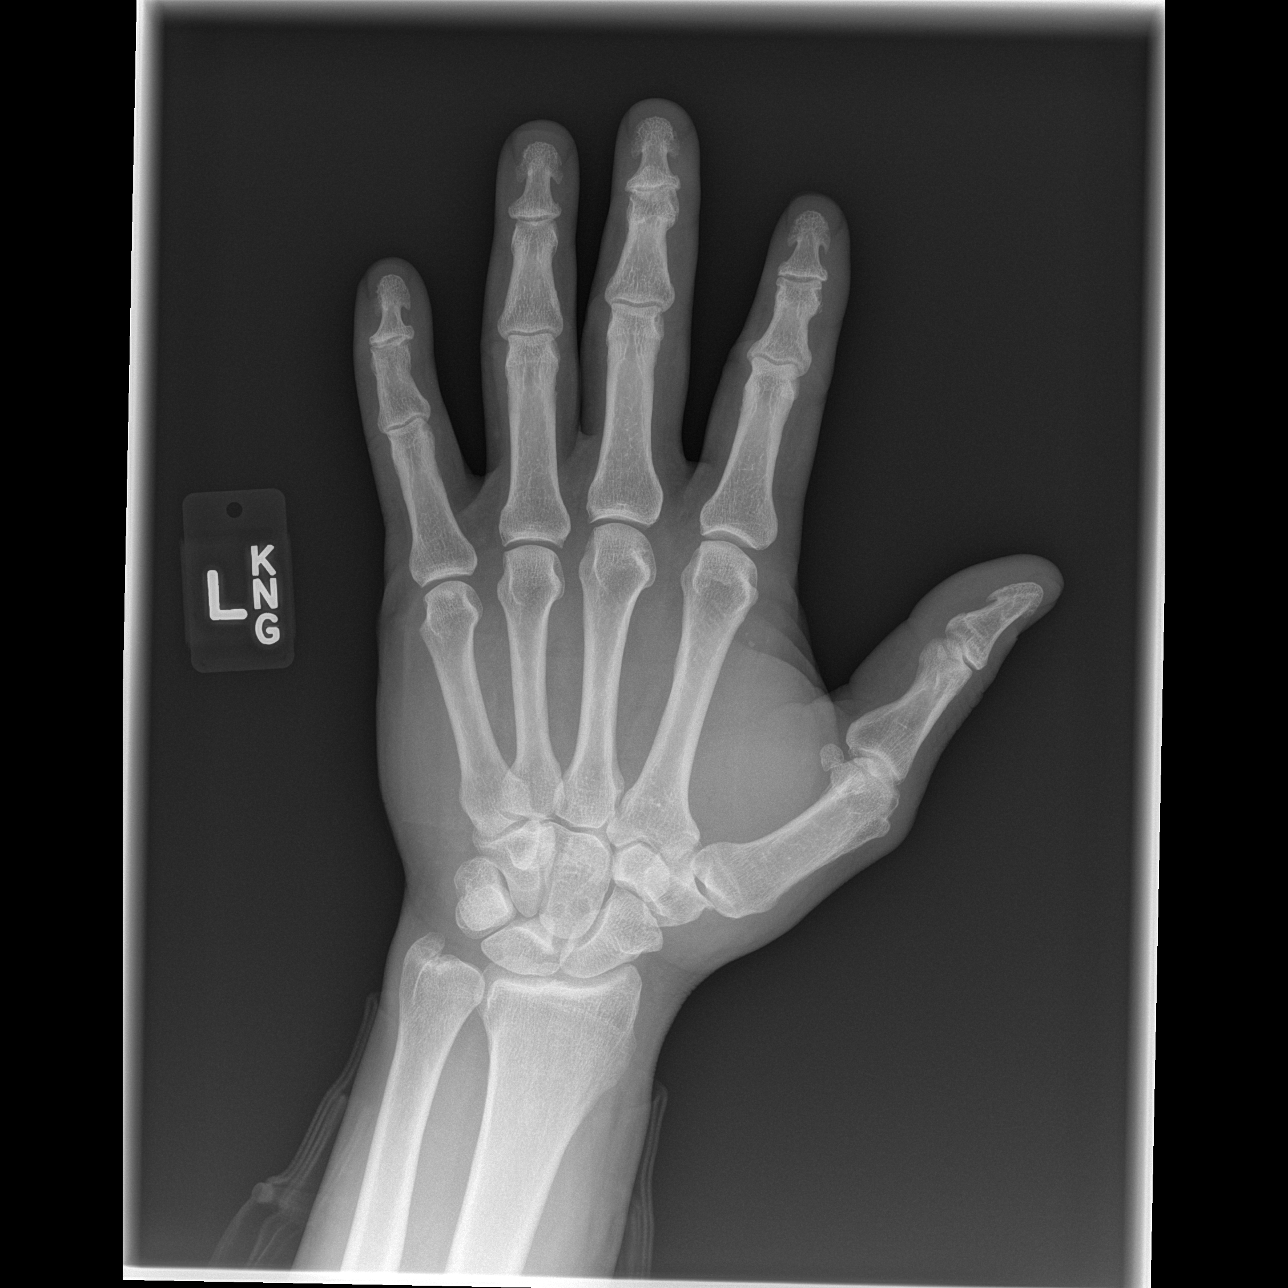

[x hand oblique left]
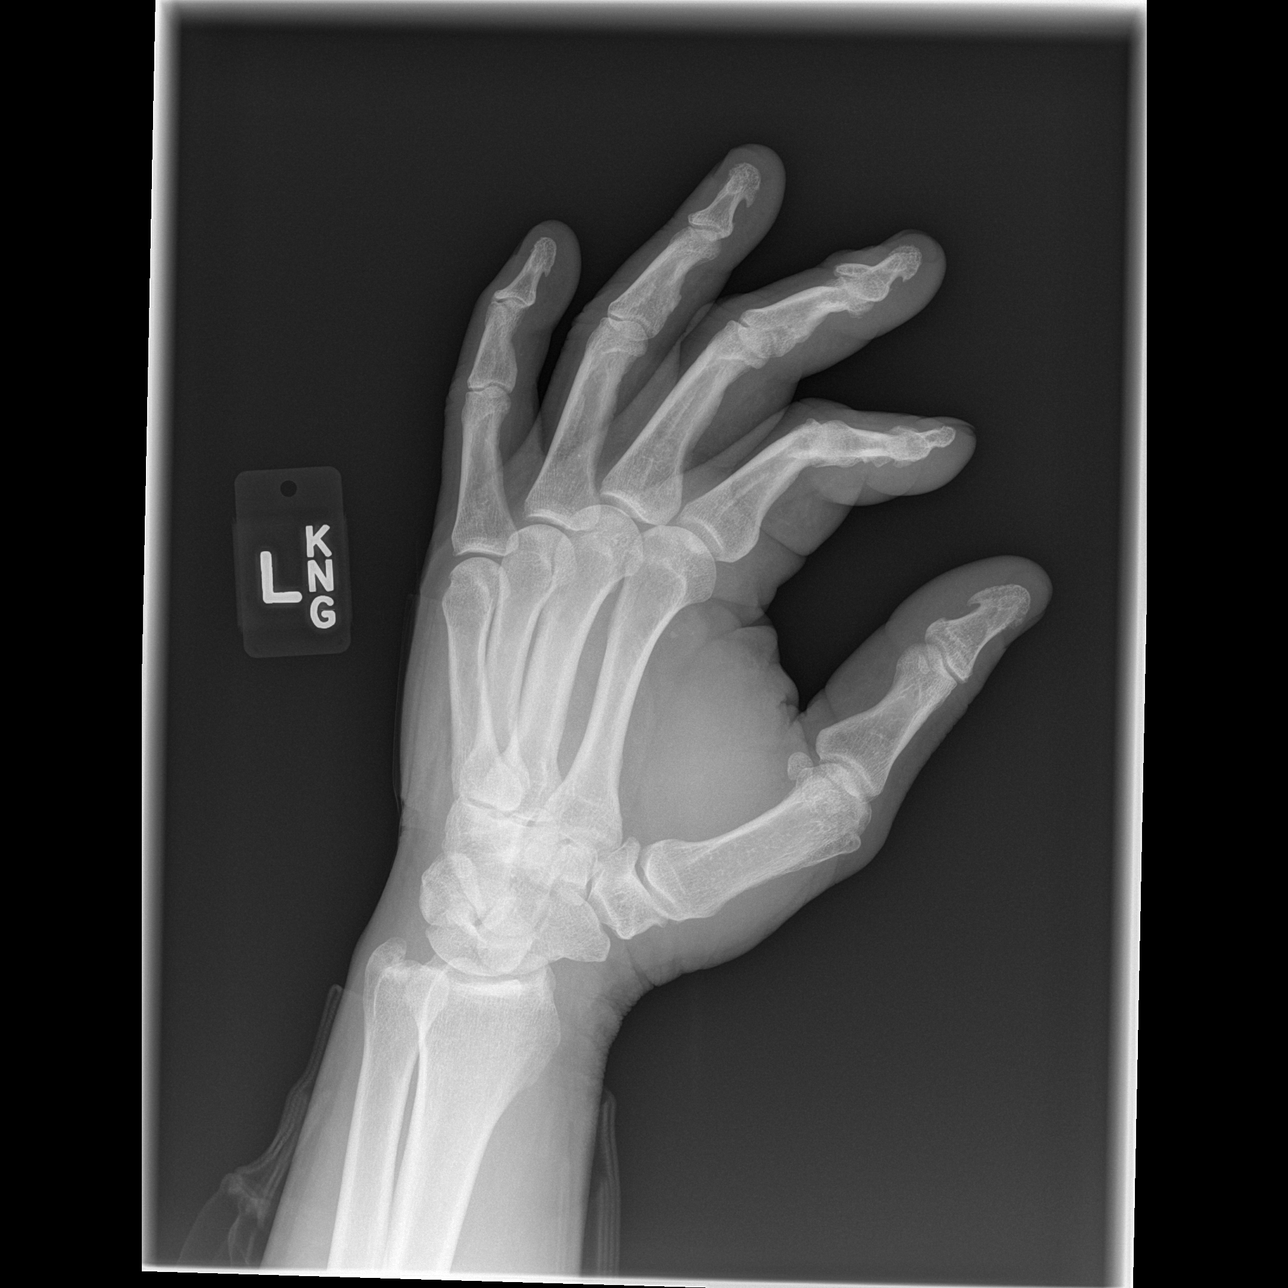

[x hand lat left]
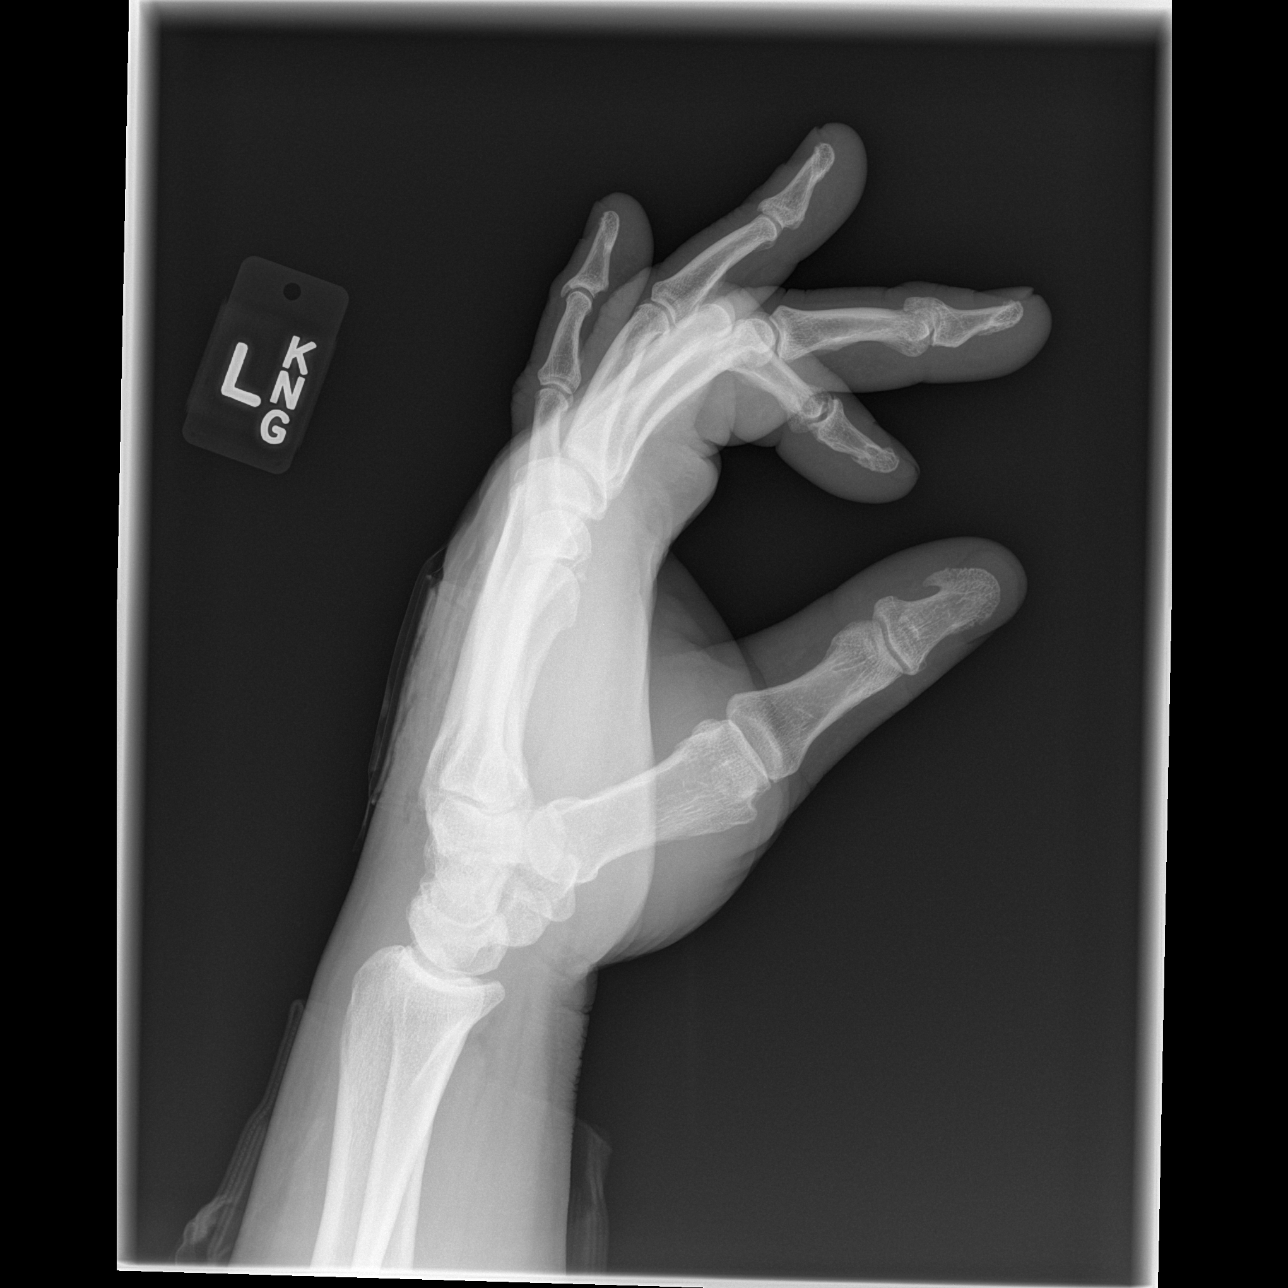

[3 of 3 positions shown; findings below may reference images not displayed]

FINDINGS: There is no evidence of fracture or dislocation. There is no
evidence of arthropathy or other focal bone abnormality. Bandage
material overlies the dorsum of the hand.
IMPRESSION: No acute bone abnormality.
